# Patient Record
Sex: Male | Born: 1960 | Race: Black or African American | Hispanic: No | Marital: Married | Smoking: Never smoker
Health system: Southern US, Community
[De-identification: ages and names within clinical notes are randomized; demographics above are authoritative.]

## PROBLEM LIST (undated history)

## (undated) DIAGNOSIS — I10 Essential (primary) hypertension: Secondary | ICD-10-CM

## (undated) DIAGNOSIS — E119 Type 2 diabetes mellitus without complications: Secondary | ICD-10-CM

---

## 2014-01-27 ENCOUNTER — Ambulatory Visit (INDEPENDENT_AMBULATORY_CARE_PROVIDER_SITE_OTHER): Payer: BC Managed Care – PPO | Admitting: Podiatry

## 2014-01-27 ENCOUNTER — Encounter: Payer: Self-pay | Admitting: Podiatry

## 2014-01-27 VITALS — Ht 72.0 in | Wt 217.0 lb

## 2014-01-27 DIAGNOSIS — G629 Polyneuropathy, unspecified: Secondary | ICD-10-CM

## 2014-01-27 DIAGNOSIS — R209 Unspecified disturbances of skin sensation: Secondary | ICD-10-CM | POA: Insufficient documentation

## 2014-01-27 DIAGNOSIS — E1149 Type 2 diabetes mellitus with other diabetic neurological complication: Secondary | ICD-10-CM

## 2014-01-27 DIAGNOSIS — E1140 Type 2 diabetes mellitus with diabetic neuropathy, unspecified: Secondary | ICD-10-CM

## 2014-01-27 DIAGNOSIS — R238 Other skin changes: Secondary | ICD-10-CM

## 2014-01-27 DIAGNOSIS — M79609 Pain in unspecified limb: Secondary | ICD-10-CM

## 2014-01-27 DIAGNOSIS — B351 Tinea unguium: Secondary | ICD-10-CM

## 2014-01-27 DIAGNOSIS — M79606 Pain in leg, unspecified: Secondary | ICD-10-CM

## 2014-01-27 DIAGNOSIS — G609 Hereditary and idiopathic neuropathy, unspecified: Secondary | ICD-10-CM

## 2014-01-27 NOTE — Progress Notes (Signed)
Subjective: Cold and numb on forefoot area bilateral duration of about 2 years. 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  Diabetic for 5 years. Last blood sugar was in 200 range for the past few months from poor diet.  Stated that he has had vascular testing done at his PCP office and waiting on the result.   Review of Systems - General ROS: negative for - chills, fatigue, fever, night sweats, sleep disturbance, weight gain or weight loss Ophthalmic ROS: Recently having blurred vision. He will have them checked shoo.  ENT ROS: negative Allergy and Immunology ROS: negative Respiratory ROS: no cough, shortness of breath, or wheezing Cardiovascular ROS: no chest pain or dyspnea on exertion Gastrointestinal ROS: no abdominal pain, change in bowel habits, or black or bloody stools Genito-Urinary ROS: no dysuria, trouble voiding, or hematuria Musculoskeletal ROS: negative Neurological ROS: no TIA or stroke symptoms Dermatological ROS: negative.  Objective: Dermatologic: Dystrophic and mycotic nails on both great toes. No open lesions noted. Vascular: Left foot pedal pulses are not palpable. Right foot Dorsalis pedis is palpable. No edema or erythema noted. Neurologic: Failed to response to Monofilament sensory testing on both feet. Normal for vibratory sensation bilateral. Normal DTR response bilateral.  Orthopedic: Hypermobile first ray with digital contracture lesser digits bilateral.  Assessment: 1. NIDDM with peripheral neuropathy. 2. Deformed metatarsal bilateral. 3. Onychomycosis both great toes.  Plan: Reviewed findings and diabetic foot care. All nails debrided.  May benefit from diabetic shoes to alleviate foot pain.  Return in 10 weeks for diabetic foot care.

## 2014-01-27 NOTE — Patient Instructions (Signed)
Seen for cold and numbness on both feet. Possible diabetic neuropathy and poor circulation.  Also debrided all nails.  Return in 10 weeks for Routine foot care.

## 2014-02-03 ENCOUNTER — Ambulatory Visit (INDEPENDENT_AMBULATORY_CARE_PROVIDER_SITE_OTHER): Payer: BC Managed Care – PPO | Admitting: Podiatry

## 2014-02-03 ENCOUNTER — Encounter: Payer: Self-pay | Admitting: Podiatry

## 2014-02-03 VITALS — BP 140/72 | HR 70 | Ht 72.0 in | Wt 217.0 lb

## 2014-02-03 DIAGNOSIS — G629 Polyneuropathy, unspecified: Secondary | ICD-10-CM

## 2014-02-03 DIAGNOSIS — M79609 Pain in unspecified limb: Secondary | ICD-10-CM

## 2014-02-03 DIAGNOSIS — E1140 Type 2 diabetes mellitus with diabetic neuropathy, unspecified: Secondary | ICD-10-CM

## 2014-02-03 DIAGNOSIS — M21969 Unspecified acquired deformity of unspecified lower leg: Secondary | ICD-10-CM

## 2014-02-03 NOTE — Patient Instructions (Signed)
Both feet casted for Orthotics. Will contact when they are ready.

## 2014-02-03 NOTE — Progress Notes (Signed)
Patient came in to prepare for Orthotics. Patient has Diabetic neuropathy bilateral. Both feet X-rays taken.  Findings reveal short and elevated first ray in high arch cavus type foot bilateral.  Both feet casted for Orthotics.

## 2014-02-04 ENCOUNTER — Ambulatory Visit: Payer: BC Managed Care – PPO | Admitting: Podiatry

## 2014-04-07 ENCOUNTER — Encounter: Payer: Self-pay | Admitting: Podiatry

## 2014-04-07 ENCOUNTER — Ambulatory Visit (INDEPENDENT_AMBULATORY_CARE_PROVIDER_SITE_OTHER): Payer: BC Managed Care – PPO | Admitting: Podiatry

## 2014-04-07 ENCOUNTER — Ambulatory Visit: Payer: BC Managed Care – PPO | Admitting: Podiatry

## 2014-04-07 VITALS — BP 140/78 | HR 81

## 2014-04-07 DIAGNOSIS — M79609 Pain in unspecified limb: Secondary | ICD-10-CM

## 2014-04-07 DIAGNOSIS — B351 Tinea unguium: Secondary | ICD-10-CM

## 2014-04-07 NOTE — Progress Notes (Signed)
Subjective:  Orthotic follow up and diabetic foot care. Doing well with orthotics. Requesting toe nails trimmed.   Objective:  Dermatologic: Dystrophic and mycotic nails on both great toes.  Vascular: Left foot pedal pulses are not palpable. Right foot Dorsalis pedis is palpable.  Neurologic: Failed to response to Monofilament sensory testing on both feet. Normal for vibratory sensation bilateral. Normal DTR response bilateral.  Orthopedic: Hypermobile first ray with digital contracture lesser digits bilateral.   Assessment: 1. NIDDM with peripheral neuropathy.  2. Deformed metatarsal bilateral.  3. Onychomycosis both great toes.   Plan: All nails debrided.  Return in 10 weeks for diabetic foot care

## 2014-04-07 NOTE — Patient Instructions (Signed)
Seen for hypertrophic nails. All nails debrided. Return in 3 months or as needed.  

## 2014-04-21 ENCOUNTER — Ambulatory Visit: Payer: BC Managed Care – PPO | Admitting: Podiatry

## 2014-06-16 ENCOUNTER — Ambulatory Visit: Payer: BC Managed Care – PPO | Admitting: Podiatry

## 2016-02-07 ENCOUNTER — Encounter: Payer: Self-pay | Admitting: Podiatry

## 2016-02-07 ENCOUNTER — Ambulatory Visit (INDEPENDENT_AMBULATORY_CARE_PROVIDER_SITE_OTHER): Payer: BLUE CROSS/BLUE SHIELD | Admitting: Podiatry

## 2016-02-07 DIAGNOSIS — E0842 Diabetes mellitus due to underlying condition with diabetic polyneuropathy: Secondary | ICD-10-CM

## 2016-02-07 DIAGNOSIS — B351 Tinea unguium: Secondary | ICD-10-CM | POA: Diagnosis not present

## 2016-02-07 DIAGNOSIS — M79673 Pain in unspecified foot: Secondary | ICD-10-CM | POA: Diagnosis not present

## 2016-02-07 DIAGNOSIS — M216X9 Other acquired deformities of unspecified foot: Secondary | ICD-10-CM | POA: Diagnosis not present

## 2016-02-07 DIAGNOSIS — M722 Plantar fascial fibromatosis: Secondary | ICD-10-CM | POA: Diagnosis not present

## 2016-02-07 DIAGNOSIS — M79606 Pain in leg, unspecified: Secondary | ICD-10-CM

## 2016-02-07 DIAGNOSIS — M216X1 Other acquired deformities of right foot: Secondary | ICD-10-CM

## 2016-02-07 DIAGNOSIS — M216X2 Other acquired deformities of left foot: Principal | ICD-10-CM

## 2016-02-07 NOTE — Progress Notes (Signed)
Subjective: 55 year old male presents complaining of pain in arches and lower legs. The pain was very severe this morning. Most cases the feet hurt after been on feet for several hours. 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  He is wearing Orthotics in his work shoes.  Diabetic for 5 years and under control.   Review of Systems - General ROS: negative for - chills, fatigue, fever, night sweats, sleep disturbance, weight gain or weight loss Ophthalmic ROS: Recently having blurred vision. He will have them checked shoo.  ENT ROS: negative Allergy and Immunology ROS: negative Respiratory ROS: no cough, shortness of breath, or wheezing Cardiovascular ROS: no chest pain or dyspnea on exertion Gastrointestinal ROS: no abdominal pain, change in bowel habits, or black or bloody stools Genito-Urinary ROS: no dysuria, trouble voiding, or hematuria Musculoskeletal ROS: negative Neurological ROS: no TIA or stroke symptoms Dermatological ROS: negative.  Objective: Dermatologic: Dystrophic and mycotic nails on both great toes. No open lesions noted. Vascular: Left foot pedal pulses are not palpable. Right foot Dorsalis pedis is palpable. No edema or erythema noted. Neurologic: Positive of subjective numbness on both feet.  Orthopedic: Hypermobile first ray with digital contracture lesser digits bilateral. Tight Achilles tendon bilateral. Unable to dorsiflex beyond 90 degrees at the ankle joint with knee extended position.   Assessment: 1. NIDDM with peripheral neuropathy. 2. Deformed metatarsal bilateral. 3. Onychomycosis both great toes. 4. Ankle equinus bilateral. 5. Pain in mid plantar fascial band bilateral.   Plan: Reviewed findings and diabetic foot care. All nails debrided.  Reviewed stretch exercise for tight Achilles tendon bilateral. Advised to get quality work boots.  May benefit from Physical Therapy Consult.  Return in one month for follow up.

## 2016-02-07 NOTE — Patient Instructions (Signed)
Seen for pain in arch and lower limb bilateral. Noted of tight Achilles tendon bilateral.  Need daily stretch exercise. Will benefit from Physical Therapy.  Return in one month.

## 2016-02-09 ENCOUNTER — Ambulatory Visit: Payer: Self-pay | Admitting: Rehabilitative and Restorative Service Providers"

## 2016-02-11 ENCOUNTER — Ambulatory Visit: Payer: Self-pay | Admitting: Rehabilitative and Restorative Service Providers"

## 2016-02-16 ENCOUNTER — Encounter: Payer: Self-pay | Admitting: Rehabilitative and Restorative Service Providers"

## 2016-02-16 ENCOUNTER — Ambulatory Visit (INDEPENDENT_AMBULATORY_CARE_PROVIDER_SITE_OTHER): Payer: BLUE CROSS/BLUE SHIELD | Admitting: Rehabilitative and Restorative Service Providers"

## 2016-02-16 DIAGNOSIS — M79672 Pain in left foot: Secondary | ICD-10-CM | POA: Diagnosis not present

## 2016-02-16 DIAGNOSIS — R29898 Other symptoms and signs involving the musculoskeletal system: Secondary | ICD-10-CM

## 2016-02-16 DIAGNOSIS — M79671 Pain in right foot: Secondary | ICD-10-CM

## 2016-02-16 NOTE — Therapy (Signed)
[facility] [address] [place], [place], [address] Phone: [phone]   Fax:  [phone]  Physical Therapy Evaluation  Patient Details  Name: Christopher Erickson MRN: [id number] Date of Birth: 1961/06/17 Referring Provider: Dr. Koren Shiver   Encounter Date: 02/16/2016      PT End of Session - 02/16/16 1610    Visit Number 1   Number of Visits 12   Date for PT Re-Evaluation 04/02/16   PT Start Time 1610   PT Stop Time 1705   PT Time Calculation (min) 55 min   Activity Tolerance Patient tolerated treatment well      History reviewed. No pertinent past medical history.  History reviewed. No pertinent past surgical history.  There were no vitals filed for this visit.       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 Christopher Erickson has pain in bilat feet on the bottom of both feet about equally - present for ~3 years. Christopher Erickson has been treated by MD with stretches which have helped some. Christopher Erickson has had an insert for the past couple of years. Feels some "numbness" in the back of both calves.    Pertinent History Denies any other musculoskeletal problems    How long can you sit comfortably? no problem    How long can you stand comfortably? 5 hours    How long can you walk comfortably? 5 hours    Patient Stated Goals get rid of the foot pain    Currently in Pain? Yes   Pain Score 5    Pain Location Foot   Pain Orientation --  right and left - both the same    Pain Descriptors / Indicators Aching;Sore   Pain Type Chronic pain   Pain Radiating Towards up into the calves    Pain Onset More than a month ago   Pain Frequency Intermittent   Aggravating Factors  prolonged standing; walking; get tight at night    Pain Relieving Factors stretches             [facility] PT Assessment - 02/16/16 0001    Assessment   Medical Diagnosis Bilat foot pain    Referring Provider  Dr. Koren Shiver    Onset Date/Surgical Date 01/19/13   Hand Dominance Right   Next MD Visit 7/17   Prior Therapy none    Precautions   Precautions None   Required Braces or Orthoses --  has inserts for shoes    Balance Screen   Has the patient fallen in the past 6 months No   Has the patient had a decrease in activity level because of a fear of falling?  No   Is the patient reluctant to leave their home because of a fear of falling?  No   Prior Function   Level of Independence Independent   Vocation Full time employment  working 60 hr/wk    [employer] - lifting; reading; pulling; pushing; walking; etc     Leisure yard work; church; going out to eat    Observation/Other Assessments   Focus on Therapeutic Outcomes (FOTO)  25% limitation    Sensation   Additional Comments burning in the bottom of his feet    Posture/Postural Control   Posture Comments stands with knees hyperextended; increased weight bearing along the lateral border of both feet   AROM   Overall AROM Comments Some end range  tightness through bilat hips in all planes    Right Ankle Dorsiflexion -12   Right Ankle Plantar Flexion 52   Right Ankle Inversion 20   Right Ankle Eversion 8   Left Ankle Dorsiflexion -8   Left Ankle Plantar Flexion 40   Left Ankle Inversion 27   Left Ankle Eversion 8   Strength   Overall Strength Comments 5/5 bilat LE's except heel raises 5/-5 Rt; 4+/5 Lt    Flexibility   Hamstrings tightness bilat ~ 65-70 deg    Quadriceps Rt 120 deg knee flexion, Lt 114 knee flexion    ITB tight bilat    Piriformis tight bilat R > Lt    Palpation   Palpation comment tender to palpation along the plantar surface of bilat feet toes to calcaneous    Balance   Balance Assessed --  SLS - 10 sec Rt; 6 sec Lt without UE support    Functional Gait  Assessment   Gait assessed  --  ambulates with increased WB lateral borders of feet                    [facility] Adult PT  Treatment/Exercise - 02/16/16 0001    Self-Care   Self-Care Other Self-Care Comments   Other Self-Care Comments  Pt educated on how to ice/ ice massage to bottom feet. Pt verbalized understanding   Exercises   Exercises Knee/Hip   Knee/Hip Exercises: Stretches   Passive Hamstring Stretch Right;Left;3 reps;30 seconds   Quad Stretch Right;Left;2 reps;30 seconds   ITB Stretch Right;Left;2 reps;30 seconds   Piriformis Stretch Left;Right;2 reps;30 seconds   Gastroc Stretch Right;Left;2 reps;30 seconds   Soleus Stretch Right;Left;2 reps;30 seconds   Manual Therapy   Manual Therapy Taping   Manual therapy comments Rock tape applied to plantar surface of both feet to decrease pain and provide support.                  PT Education - 02/16/16 1710    Education provided Yes   Education Details HEP    Person(s) Educated Patient   Methods Explanation;Demonstration;Tactile cues;Verbal cues;Handout   Comprehension Verbalized understanding;Returned demonstration;Verbal cues required;Tactile cues required             PT Long Term Goals - 02/16/16 1644    PT LONG TERM GOAL #1   Title Improve bilat ankle DF to 5-8 deg 04/02/16   Time 6   Period Weeks   Status New   PT LONG TERM GOAL #2   Title Improve flexibility through bilat LE's with patient to demonstrate good moblity with stretching for LE's 04/02/16   Time 6   Period Weeks   Status New   PT LONG TERM GOAL #3   Title Increase standing and walking tolerance allowing patient to stand for 6-8 hr/day without significant increase in pain 04/02/16   Time 6   Period Weeks   Status New   PT LONG TERM GOAL #4   Title Independent in HEP 04/02/16   Time 6   Period Weeks   Status New   PT LONG TERM GOAL #5   Title Improve FOTO to </= 22% limitation 04/02/16   Time 6   Period Weeks   Status New               Plan - 02/16/16 1640    Clinical Impression Statement Christopher Erickson presents with c/o bilat foot pain which is  chronic in nature - present for the  past 3 years. Christopher Erickson has limited mobility thorugh bilat hips and ankles; weakness bilat ankle plantar flexion; decresed balance; pain with functional activities.    Rehab Potential Good   PT Frequency 2x / week   PT Duration 6 weeks   PT Treatment/Interventions Patient/family education;ADLs/Self Care Home Management;Cryotherapy;Electrical Stimulation;Iontophoresis 4mg /ml Dexamethasone;Moist Heat;Ultrasound;Balance training;Neuromuscular re-education;Manual techniques;Dry needling;Therapeutic activities;Therapeutic exercise   PT Next Visit Plan progress with stretching; manual work through the [employer] Pepsigastroc/soleus; mobilization bilat ankles; modalities as indicated    Consulted and Agree with Plan of Care Patient      Patient will benefit from skilled therapeutic intervention in order to improve the following deficits and impairments:  Improper body mechanics, Pain, Decreased range of motion, Decreased mobility, Decreased strength, Increased fascial restricitons, Increased muscle spasms, Decreased balance, Decreased activity tolerance  Visit Diagnosis: Pain in left foot - Plan: PT plan of care cert/re-cert  Pain in right foot - Plan: PT plan of care cert/re-cert  Other symptoms and signs involving the musculoskeletal system - Plan: PT plan of care cert/re-cert     Problem List Patient Active Problem List   Diagnosis Date Noted  . 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 Bilateral cold feet 01/27/2014  . Peripheral neuropathy (HCC) 01/27/2014  . Diabetic neuropathy, type II diabetes mellitus (HCC) 01/27/2014  . Onychomycosis 01/27/2014    Christopher Erickson PT, MPH  02/16/2016, 5:14 PM  [facility] [address] [place], [place], [address] Phone: [phone]   Fax:  [phone]  Name: Christopher Erickson MRN: [id number] Date of Birth: 12/21/1960

## 2016-02-16 NOTE — Patient Instructions (Signed)
Hamstring Step 1    Straighten left knee. Keep knee level with other knee or on bolster. Hold __30_ seconds. Relax knee by returning foot to start. Repeat _2-3__ times. Outer Hip Stretch: Reclined IT Band Stretch (Strap)    Strap around opposite foot, pull across only as far as possible with shoulders on mat. Hold for __30__ seconds.  Repeat _2-3___ times each leg.  Piriformis Stretch    Lying on back, pull right knee toward opposite shoulder. Hold __30__ seconds. Repeat _2-3___ times. Do __2__ sessions per day.  KNEE: Quadriceps - Prone    Place strap around ankle. Bring ankle toward buttocks. Press hip into surface. Hold __30_ seconds. _2__ reps per set, _2__ sets per day, _   * ice to bottom of feet x 10-15 min.  * ice massage (water bottle) to bottom of feet   [facility] [address] [place], [place] [address]  [phone] (office) [phone] (fax)

## 2016-02-23 ENCOUNTER — Ambulatory Visit (INDEPENDENT_AMBULATORY_CARE_PROVIDER_SITE_OTHER): Payer: BLUE CROSS/BLUE SHIELD | Admitting: Physical Therapy

## 2016-02-23 DIAGNOSIS — R29898 Other symptoms and signs involving the musculoskeletal system: Secondary | ICD-10-CM

## 2016-02-23 DIAGNOSIS — M79671 Pain in right foot: Secondary | ICD-10-CM | POA: Diagnosis not present

## 2016-02-23 DIAGNOSIS — M79672 Pain in left foot: Secondary | ICD-10-CM | POA: Diagnosis not present

## 2016-02-23 NOTE — Therapy (Signed)
[facility] [address] [place], [place], [address] Phone: [phone]   Fax:  [phone]  Physical Therapy Treatment  Patient Details  Name: Christopher Erickson MRN: [id number] Date of Birth: 10/25/1960 Referring Provider: Dr. Koren ShiverMyeong Sheard  Encounter Date: 02/23/2016      PT End of Session - 02/23/16 1612    Visit Number 2   Number of Visits 12   Date for PT Re-Evaluation 04/02/16   PT Start Time 1605   PT Stop Time 1652   PT Time Calculation (min) 47 min   Activity Tolerance Patient tolerated treatment well;No increased pain      No past medical history on file.  No past surgical history on file.  There were no vitals filed for this visit.      Subjective Assessment - 02/23/16 1613    Subjective Won reports 60% improvement since last visit. Compliant with HEP. His legs throb after he performs them, but a little after that legs are less painful.  He  reports the Rock tape stayed on his feet for 5 days; helped reduce pain by 4 points.    Numbness in feet and back of calves has persisted, although reduced.     Patient Stated Goals get rid of the foot pain    Currently in Pain? Yes   Pain Score 5    Pain Location Foot   Pain Orientation Right;Left   Pain Descriptors / Indicators Sore            [facility] PT Assessment - 02/23/16 0001    Assessment   Medical Diagnosis Bilat foot pain    Referring Provider Dr. Koren ShiverMyeong Sheard   Onset Date/Surgical Date 01/19/13   Hand Dominance Right   Next MD Visit 03/06/16   AROM   Right Ankle Dorsiflexion -8   Right Ankle Plantar Flexion 55   Right Ankle Inversion 34   Right Ankle Eversion 15   Left Ankle Dorsiflexion -5   Left Ankle Plantar Flexion 40   Left Ankle Inversion 28   Left Ankle Eversion 15   Flexibility   Hamstrings Rt/Lt ~55 deg           [facility] Adult PT Treatment/Exercise - 02/23/16 0001    Knee/Hip Exercises: Stretches   Passive Hamstring  Stretch Right;Left;3 reps;30 seconds   Quad Stretch Right;Left;2 reps;30 seconds   ITB Stretch Right;Left;2 reps;30 seconds   Gastroc Stretch Right;Left;30 seconds;4 reps   Soleus Stretch Right;Left;30 seconds;4 reps   Knee/Hip Exercises: Aerobic   Nustep L4: 5 mi n   Manual Therapy   Manual Therapy Taping;Joint mobilization;Soft tissue mobilization   Manual therapy comments Rock tape applied to plantar surface of both feet to decrease pain and provide support.     Joint Mobilization Talocrural jt mobs Rt/Lt Grade 2-3.  calcaneal rock.     Soft tissue mobilization to Rt/ Lt plantar fascia.    Ankle Exercises: Stretches   Other Stretch shown ankle stretch off stair.     Ankle Exercises: Seated   Other Seated Ankle Exercises ankle circles x 15 reps each foot.                 PT Education - 02/23/16 1632    Education provided Yes   Education Details HEP   Person(s) Educated Patient   Methods Explanation   Comprehension Verbalized understanding;Returned demonstration             PT Long Term Goals - 02/16/16 1644  PT LONG TERM GOAL #1   Title Improve bilat ankle DF to 5-8 deg 04/02/16   Time 6   Period Weeks   Status New   PT LONG TERM GOAL #2   Title Improve flexibility through bilat LE's with patient to demonstrate good moblity with stretching for LE's 04/02/16   Time 6   Period Weeks   Status New   PT LONG TERM GOAL #3   Title Increase standing and walking tolerance allowing patient to stand for 6-8 hr/day without significant increase in pain 04/02/16   Time 6   Period Weeks   Status New   PT LONG TERM GOAL #4   Title Independent in HEP 04/02/16   Time 6   Period Weeks   Status New   PT LONG TERM GOAL #5   Title Improve FOTO to </= 22% limitation 04/02/16   Time 6   Period Weeks   Status New               Plan - 02/23/16 1655    Clinical Impression Statement Pt had positive response to stretches and Rock tape applied to plantar surface of  feet.  Pt demonstrated slight improvement in ankle ROM.  Pt tolerated new HEP exercise.     Rehab Potential Good   PT Frequency 2x / week   PT Duration 6 weeks   PT Treatment/Interventions Patient/family education;ADLs/Self Care Home Management;Cryotherapy;Electrical Stimulation;Iontophoresis 4mg /ml Dexamethasone;Moist Heat;Ultrasound;Balance training;Neuromuscular re-education;Manual techniques;Dry needling;Therapeutic activities;Therapeutic exercise   PT Next Visit Plan progress with stretching; manual work through the [employer] Pepsigastroc/soleus; mobilization bilat ankles; modalities as indicated    Consulted and Agree with Plan of Care Patient      Patient will benefit from skilled therapeutic intervention in order to improve the following deficits and impairments:  Improper body mechanics, Pain, Decreased range of motion, Decreased mobility, Decreased strength, Increased fascial restricitons, Increased muscle spasms, Decreased balance, Decreased activity tolerance  Visit Diagnosis: Pain in left foot  Pain in right foot  Other symptoms and signs involving the musculoskeletal system     Problem List Patient Active Problem List   Diagnosis Date Noted  . 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 Bilateral cold feet 01/27/2014  . Peripheral neuropathy (HCC) 01/27/2014  . Diabetic neuropathy, type II diabetes mellitus (HCC) 01/27/2014  . Onychomycosis 01/27/2014   Mayer CamelJennifer Carlson-Long, PTA 02/23/2016 5:01 PM  [facility] [address] [place], [place], [address] Phone: [phone]   Fax:  [phone]  Name: Christopher Erickson MRN: [id number] Date of Birth: 04/26/1961

## 2016-02-23 NOTE — Patient Instructions (Addendum)
Soleus Stretch - Standing    Stand with uninvolved leg behind, slightly bent, heel on floor. Lean into wall until stretch is felt in calf. Hold for __30_ seconds. Repeat on involved leg. Repeat _2__ times. Do _2__ times per day.  * Hang heels off step (both a straight knee, then a bent knee.) Ankle Circles    Slowly rotate right foot and ankle clockwise then counterclockwise. Gradually increase range of motion. Avoid pain. Circle _10___ times each direction per set. Do _2___ sets per session. Do __1-2__ sessions per day.    [facility] [address] [place], [place] [address]  [phone] (office) [phone] (fax)

## 2016-02-25 ENCOUNTER — Encounter: Payer: BLUE CROSS/BLUE SHIELD | Admitting: Physical Therapy

## 2016-03-01 ENCOUNTER — Ambulatory Visit (INDEPENDENT_AMBULATORY_CARE_PROVIDER_SITE_OTHER): Payer: Self-pay | Admitting: Rehabilitative and Restorative Service Providers"

## 2016-03-01 ENCOUNTER — Encounter: Payer: Self-pay | Admitting: Rehabilitative and Restorative Service Providers"

## 2016-03-01 DIAGNOSIS — R29898 Other symptoms and signs involving the musculoskeletal system: Secondary | ICD-10-CM

## 2016-03-01 DIAGNOSIS — M79672 Pain in left foot: Secondary | ICD-10-CM

## 2016-03-01 DIAGNOSIS — M79671 Pain in right foot: Secondary | ICD-10-CM

## 2016-03-01 NOTE — Therapy (Signed)
[facility] [address] [place], [place], [address] Phone: [phone]   Fax:  [phone]  Physical Therapy Treatment  Patient Details  Name: Christopher MaskerSylvester Dise MRN: [id number] Date of Birth: 10/16/1960 Referring Provider: Dr. Koren ShiverMyeong Sheard  Encounter Date: 03/01/2016      PT End of Session - 03/01/16 1521    Visit Number 3   Number of Visits 12   Date for PT Re-Evaluation 04/02/16   PT Start Time 1515   PT Stop Time 1605   PT Time Calculation (min) 50 min   Activity Tolerance Patient tolerated treatment well      History reviewed. No pertinent past medical history.  History reviewed. No pertinent past surgical history.  There were no vitals filed for this visit.      Subjective Assessment - 03/01/16 1524    Subjective some flare up of the foot pain today - has been standing and walking much more today. TRape helps but only stayed on 2 days due to the heat. Working on exercises at home.   Currently in Pain? Yes   Pain Score 6    Pain Location Foot   Pain Orientation Right;Left   Pain Descriptors / Indicators Sore   Pain Type Chronic pain   Pain Onset More than a month ago                         [facility] - 03/01/16 0001    Therapeutic Activites    Therapeutic Activities --  trigger point release work/stick for bilat calf/thigh/foot   Knee/Hip Exercises: Stretches   Passive Hamstring Stretch Right;Left;3 reps;30 seconds   Quad Stretch Right;Left;2 reps;30 seconds   ITB Stretch Right;Left;2 reps;30 seconds   Piriformis Stretch Right;Left;3 reps;30 seconds   Piriformis Stretch Limitations ankle across knee to stretch into rotation 3 x 30 sec Rt/Lt    Gastroc Stretch Right;Left;30 seconds;4 reps   Soleus Stretch Right;Left;30 seconds;4 reps   Knee/Hip Exercises: Aerobic   Nustep L7: 5 min   Knee/Hip Exercises: Supine   Other Supine Knee/Hip Exercises alternate  knee drop for relaxation - in hooklying x 10-15   Manual Therapy   Manual Therapy Taping;Joint mobilization;Soft tissue mobilization   Manual therapy comments Rock tape applied to plantar surface of both feet to decrease pain and provide support.     Joint Mobilization Talocrural jt mobs Rt/Lt Grade 2-3.  calcaneal rock.     Soft tissue mobilization to Rt/ Lt plantar fascia.           Trigger Point Dry Needling - 03/01/16 1552    Consent Given? Yes   Education Handout Provided Yes   Gastrocnemius Response Twitch response elicited;Palpable increased muscle length   Soleus Response Twitch response elicited;Palpable increased muscle length              PT Education - 03/01/16 1604    Education provided Yes   Education Details HEP TDN   Person(s) Educated Patient   Methods Explanation   Comprehension Verbalized understanding             PT Long Term Goals - 03/01/16 1523    PT LONG TERM GOAL #1   Title Improve bilat ankle DF to 5-8 deg 04/02/16   Time 6   Period Weeks   Status On-going   PT LONG TERM GOAL #2   Title Improve flexibility through bilat LE's with patient to demonstrate good moblity with  stretching for LE's 04/02/16   Time 6   Period Weeks   Status On-going   PT LONG TERM GOAL #3   Title Increase standing and walking tolerance allowing patient to stand for 6-8 hr/day without significant increase in pain 04/02/16   Time 6   Period Weeks   Status On-going   PT LONG TERM GOAL #4   Title Independent in HEP 04/02/16   Time 6   Period Weeks   Status On-going   PT LONG TERM GOAL #5   Title Improve FOTO to </= 22% limitation 04/02/16   Time 6   Period Weeks   Status On-going               Plan - 03/01/16 1522    Clinical Impression Statement some increase in pain today from more walking and work. Tape seems to help - only stayed on 2 days this time. Good improvement in tissue extensibility. Responded well to TDN. Progressing toward stated goals  of therapy.    Rehab Potential Good   PT Frequency 2x / week   PT Duration 6 weeks   PT Treatment/Interventions Patient/family education;ADLs/Self Care Home Management;Cryotherapy;Electrical Stimulation;Iontophoresis /ml Dexamethasone;Moist Heat;Ultrasound;Balance training;Neuromuscular re-education;Manual techniques;Dry needling;Therapeutic activities;Therapeutic exercise   PT Next Visit Plan progress with stretching; manual work through the [employer]; mobilization bilat ankles; modalities as indicated  Assess response to TDN    Consulted and Agree with Plan of Care Patient      Patient will benefit from skilled therapeutic intervention in order to improve the following deficits and impairments:  Improper body mechanics, Pain, Decreased range of motion, Decreased mobility, Decreased strength, Increased fascial restricitons, Increased muscle spasms, Decreased balance, Decreased activity tolerance  Visit Diagnosis: Pain in left foot  Pain in right foot  Other symptoms and signs involving the musculoskeletal system     Problem List Patient Active Problem List   Diagnosis Date Noted  . 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 Bilateral cold feet 01/27/2014  . Peripheral neuropathy (HCC) 01/27/2014  . Diabetic neuropathy, type II diabetes mellitus (HCC) 01/27/2014  . Onychomycosis 01/27/2014    Renee Beale Rober Minion  PT, MPH  03/01/2016, 4:11 PM  [facility] [address] [place], [place], [address] Phone: [phone]   Fax:  [phone]  Name: Christopher Erickson MRN: [id number] Date of Birth: 29-Jun-1961

## 2016-03-01 NOTE — Patient Instructions (Signed)

## 2016-03-06 ENCOUNTER — Ambulatory Visit: Payer: BLUE CROSS/BLUE SHIELD | Admitting: Podiatry

## 2016-03-08 ENCOUNTER — Ambulatory Visit (INDEPENDENT_AMBULATORY_CARE_PROVIDER_SITE_OTHER): Payer: Self-pay | Admitting: Rehabilitative and Restorative Service Providers"

## 2016-03-08 ENCOUNTER — Encounter: Payer: Self-pay | Admitting: Rehabilitative and Restorative Service Providers"

## 2016-03-08 DIAGNOSIS — R29898 Other symptoms and signs involving the musculoskeletal system: Secondary | ICD-10-CM

## 2016-03-08 DIAGNOSIS — M79671 Pain in right foot: Secondary | ICD-10-CM

## 2016-03-08 DIAGNOSIS — M79672 Pain in left foot: Secondary | ICD-10-CM

## 2016-03-08 NOTE — Therapy (Addendum)
[facility] [place], [place], [address] Phone: [phone]   Fax:  [phone]  Physical Therapy Treatment  Patient Details  Name: Christopher Erickson MRN: [id number] Date of Birth: [birth year] Referring Provider: Dr. Estelle Grumbles  Encounter Date: 03/08/2016      PT End of Session - 03/08/16 1607    Visit Number 4   Number of Visits 12   Date for PT Re-Evaluation 04/02/16   PT Start Time 1600   PT Stop Time 1651   PT Time Calculation (min) 51 min   Activity Tolerance Patient tolerated treatment well      History reviewed. No pertinent past medical history.  History reviewed. No pertinent past surgical history.  There were no vitals filed for this visit.      Subjective Assessment - 03/08/16 1609    Subjective patient reports that he continues to improve. Tape continues to help and lasted 3 days this time. TDN helped but he was very sore for 1-2 days. has some numbness in his toes.    Currently in Pain? Yes   Pain Score 4    Pain Location Foot   Pain Orientation Right;Left   Pain Descriptors / Indicators Sore   Pain Type Chronic pain   Pain Onset More than a month ago   Pain Frequency Intermittent                         [facility] Adult PT Treatment/Exercise - 03/08/16 0001    Knee/Hip Exercises: Stretches   Passive Hamstring Stretch Right;Left;3 reps;30 seconds   Quad Stretch Right;Left;2 reps;30 seconds   ITB Stretch Right;Left;2 reps;30 seconds   Piriformis Stretch Right;Left;3 reps;30 seconds   Gastroc Stretch Right;Left;30 seconds;4 reps   Soleus Stretch Right;Left;30 seconds;4 reps   Knee/Hip Exercises: Aerobic   Nustep L7: 5 min                     PT Long Term Goals - 03/08/16 1644    PT LONG TERM GOAL #1   Title Improve bilat ankle DF to 5-8 deg 04/02/16   Time 6   Period Weeks   Status On-going   PT LONG TERM GOAL #2   Title Improve flexibility  through bilat LE's with patient to demonstrate good moblity with stretching for LE's 04/02/16   Time 6   Period Weeks   Status On-going   PT LONG TERM GOAL #3   Title Increase standing and walking tolerance allowing patient to stand for 6-8 hr/day without significant increase in pain 04/02/16   Time 6   Period Weeks   Status On-going   PT LONG TERM GOAL #4   Title Independent in HEP 04/02/16   Time 6   Period Weeks   Status On-going   PT LONG TERM GOAL #5   Title Improve FOTO to </= 22% limitation 04/02/16   Time 6   Period Weeks   Status On-going               Plan - 03/08/16 1641    Clinical Impression Statement Patient reports improved pain and mobility through the feet and legs. Cont to respond to taping and stretching. Progressing gradually toward stated goals.    Rehab Potential Good   PT Frequency 2x / week   PT Duration 6 weeks   PT Treatment/Interventions Patient/family education;ADLs/Self Care Home Management;Cryotherapy;Electrical Stimulation;Iontophoresis 75m/ml Dexamethasone;Moist Heat;Ultrasound;Balance training;Neuromuscular re-education;Manual techniques;Dry needling;Therapeutic activities;Therapeutic exercise  PT Next Visit Plan progress with stretching; manual work through the [employer]; mobilization bilat ankles; modalities as indicated  Assess response to TDN    Consulted and Agree with Plan of Care Patient      Patient will benefit from skilled therapeutic intervention in order to improve the following deficits and impairments:  Improper body mechanics, Pain, Decreased range of motion, Decreased mobility, Decreased strength, Increased fascial restricitons, Increased muscle spasms, Decreased balance, Decreased activity tolerance  Visit Diagnosis: Pain in left foot  Pain in right foot  Other symptoms and signs involving the musculoskeletal system     Problem List Patient Active Problem List   Diagnosis Date Noted  . Metatarsal deformity  02/03/2014  . Bilateral cold feet 01/27/2014  . Peripheral neuropathy ([place]) 01/27/2014  . Diabetic neuropathy, type II diabetes mellitus ([place]) 01/27/2014  . Onychomycosis 01/27/2014    Christopher Erickson Nilda Simmer PT, MPH  03/08/2016, 4:47 PM  [facility] Christopher Erickson [place], [place], [address] Phone: [phone]   Fax:  [phone]  Name: Christopher Erickson MRN: [id number] Date of Birth: 01-11-1961  PHYSICAL THERAPY DISCHARGE SUMMARY  Visits from Start of Care: 4  Current functional level related to goals / functional outcomes: Excellent progress through course of treatment. See last progress note for discharge status.    Remaining deficits: Continued intermittent symptoms - should continue with HEP    Education / Equipment: HEP  Plan: Patient agrees to discharge.  Patient goals were partially met. Patient is being discharged due to being pleased with the current functional level.  ?????     Christopher Erickson P. Helene Kelp PT, MPH 07/17/16 7:55 AM

## 2016-03-15 ENCOUNTER — Encounter: Payer: BLUE CROSS/BLUE SHIELD | Admitting: Rehabilitative and Restorative Service Providers"

## 2016-03-15 ENCOUNTER — Ambulatory Visit: Payer: BLUE CROSS/BLUE SHIELD | Admitting: Podiatry

## 2016-03-22 ENCOUNTER — Ambulatory Visit: Payer: BLUE CROSS/BLUE SHIELD | Admitting: Podiatry

## 2016-03-30 ENCOUNTER — Ambulatory Visit (INDEPENDENT_AMBULATORY_CARE_PROVIDER_SITE_OTHER): Payer: BLUE CROSS/BLUE SHIELD | Admitting: Podiatry

## 2016-03-30 ENCOUNTER — Encounter: Payer: Self-pay | Admitting: Podiatry

## 2016-03-30 VITALS — BP 134/78 | HR 69

## 2016-03-30 DIAGNOSIS — M216X1 Other acquired deformities of right foot: Secondary | ICD-10-CM | POA: Diagnosis not present

## 2016-03-30 DIAGNOSIS — M722 Plantar fascial fibromatosis: Secondary | ICD-10-CM

## 2016-03-30 DIAGNOSIS — M216X2 Other acquired deformities of left foot: Secondary | ICD-10-CM | POA: Diagnosis not present

## 2016-03-30 DIAGNOSIS — E0842 Diabetes mellitus due to underlying condition with diabetic polyneuropathy: Secondary | ICD-10-CM | POA: Diagnosis not present

## 2016-03-30 NOTE — Patient Instructions (Signed)
1 month follow up on tight Achilles tendon. Doing well with stretch exercise. Noted of pre ulcerative digital corn 5th digit bilateral. All debrided. Return in 2 months.

## 2016-03-30 NOTE — Progress Notes (Signed)
Subjective: 55 year old male presents one month follow up. Stated that he has been stretching the tendon as instructed and noted of improvement on his feet.  Also request toe nails trimmed.   He was seen previously for pain in arches and lower legs. Most cases the feet hurt after been on feet for several hours. 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  He is wearing Orthotics in his work shoes.  Diabetic for 5 years and under control with blood sugar in 130 level.  Objective: Dermatologic: Dystrophic and mycotic nails on both great toes. Pre ulcerative digital corn 5th digit bilateral.   Vascular: Left foot pedal pulses are not palpable. Right foot Dorsalis pedis is palpable. No edema or erythema noted. Neurologic: Positive of subjective numbness on both feet.  Orthopedic: Hypermobile first ray with digital contracture lesser digits bilateral. Tight Achilles tendon bilateral. Unable to dorsiflex beyond 90 degrees at the ankle joint with knee extended position.   Assessment: 1. NIDDM with peripheral neuropathy. 2. Deformed metatarsal bilateral. 3. Onychomycosis both great toes. 4. Ankle equinus bilateral. 5. Pain in mid plantar fascial band bilateral.  6. 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   Plan: Reviewed findings and diabetic foot care. All nails, corns debrided.  Continue with stretch exercise for tight Achilles tendon bilateral. Return in 2 month to monitor pre ulcerative digital corn 5th bilateral.

## 2016-04-25 ENCOUNTER — Ambulatory Visit: Payer: BLUE CROSS/BLUE SHIELD | Admitting: Podiatry

## 2016-06-01 ENCOUNTER — Ambulatory Visit: Payer: BLUE CROSS/BLUE SHIELD | Admitting: Podiatry

## 2017-01-16 ENCOUNTER — Ambulatory Visit (INDEPENDENT_AMBULATORY_CARE_PROVIDER_SITE_OTHER): Payer: BLUE CROSS/BLUE SHIELD | Admitting: Podiatry

## 2017-01-16 ENCOUNTER — Encounter: Payer: Self-pay | Admitting: Podiatry

## 2017-01-16 DIAGNOSIS — M722 Plantar fascial fibromatosis: Secondary | ICD-10-CM | POA: Diagnosis not present

## 2017-01-16 DIAGNOSIS — M79671 Pain in right foot: Secondary | ICD-10-CM | POA: Diagnosis not present

## 2017-01-16 DIAGNOSIS — L851 Acquired keratosis [keratoderma] palmaris et plantaris: Secondary | ICD-10-CM

## 2017-01-16 DIAGNOSIS — M79672 Pain in left foot: Secondary | ICD-10-CM

## 2017-01-16 DIAGNOSIS — B351 Tinea unguium: Secondary | ICD-10-CM | POA: Diagnosis not present

## 2017-01-16 NOTE — Patient Instructions (Signed)
Seen for painful arch, corns, calluses and toe nails. All lesions debrided. Both feet casted for Orthotics. Will call when they are ready.

## 2017-01-16 NOTE — Progress Notes (Signed)
Subjective: 56 year old male presents complaining of pain from right heel callus, digital corn, thick toe nails. Arched of both feet hurt at work and in need of a new pair orthotics. He is working outdoor 6-10 hours a day.   HPI: He was seen previously for pain in arches and lower legs. Most cases the feet hurt after been on feet for several hours. 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  He is wearing Orthotics in his work shoes.  Diabetic for 5 years and under control with blood sugar in 130 level.  Objective: Dermatologic: Dystrophic and mycotic nails on both great toes. Pre ulcerative digital corn 5th digit bilateral.   Thick keratotic lesion right posterior heel painful.  Vascular: Left foot pedal pulses are not palpable. Right foot Dorsalis pedis is palpable. No edema or erythema noted. Neurologic: Positive of subjective numbness on both feet.  Orthopedic: Contracted 5th digits with digital corns. Hypermobile first ray with digital contracture lesser digits bilateral. Tight Achilles tendon bilateral. Unable to dorsiflex beyond 90 degrees at the ankle joint with knee extended position.   Assessment: 1. NIDDM with peripheral neuropathy. 2. Deformed metatarsal bilateral. 3. Onychomycosis both great toes. 4. Ankle equinus bilateral. 5. Pain in mid plantar fascial band bilateral.  6. 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   Plan: Reviewed findings and diabetic foot care. All nails, corns debrided.  Continue with stretch exercise for tight Achilles tendon bilateral. Both feet casted for orthotics.

## 2017-01-17 ENCOUNTER — Ambulatory Visit: Payer: BLUE CROSS/BLUE SHIELD | Admitting: Podiatry

## 2017-10-11 ENCOUNTER — Ambulatory Visit: Payer: BLUE CROSS/BLUE SHIELD | Admitting: Podiatry

## 2017-10-11 ENCOUNTER — Encounter: Payer: Self-pay | Admitting: Podiatry

## 2017-10-11 DIAGNOSIS — L851 Acquired keratosis [keratoderma] palmaris et plantaris: Secondary | ICD-10-CM | POA: Diagnosis not present

## 2017-10-11 DIAGNOSIS — E0842 Diabetes mellitus due to underlying condition with diabetic polyneuropathy: Secondary | ICD-10-CM

## 2017-10-11 DIAGNOSIS — M79606 Pain in leg, unspecified: Secondary | ICD-10-CM

## 2017-10-11 DIAGNOSIS — B351 Tinea unguium: Secondary | ICD-10-CM

## 2017-10-11 NOTE — Patient Instructions (Signed)
Seen for painful nails and calluses. 5th toe right foot is pre ulcerative. Need to stay off of feet more. All lesions and nails debrided.

## 2017-10-11 NOTE — Progress Notes (Signed)
Subjective: 57 y.o. year old male patient presents complaining of painful nails and calluses. Patient requests toe nails, corns and calluses trimmed. Patient works outside doing yard and tree trimming. Having abnormal feelings under the ball of both feet.  Objective: Dermatologic: Thick yellow deformed nails x 10.  Pre ulcerative digital corn with intra dermal bleeding distal lateral 5th right. Severe plantar keratosis under 5th metatarsal base right foot. Vascular: Pedal pulses are all palpable. Orthopedic: Contracted lesser digit  5th right Neurologic: Diagnosed with diabetic neuropathy.  Assessment: Dystrophic mycotic nails x 10. Ulcerating corn 5th right with hammer toe. Lateral weight shifting with plantar callus right. Pain in both feet. Diabetic neuropathy.  Treatment: All mycotic nails, corns, calluses debrided.  Instructed to watch out for 5th toe right. Return in 3 months or sooner if needed.

## 2020-01-17 ENCOUNTER — Emergency Department (HOSPITAL_COMMUNITY): Payer: BLUE CROSS/BLUE SHIELD

## 2020-01-17 ENCOUNTER — Other Ambulatory Visit: Payer: Self-pay

## 2020-01-17 ENCOUNTER — Emergency Department (HOSPITAL_COMMUNITY)
Admission: EM | Admit: 2020-01-17 | Discharge: 2020-01-17 | Disposition: A | Discharge status: Home / Self Care | Payer: BLUE CROSS/BLUE SHIELD | Attending: Emergency Medicine | Admitting: Emergency Medicine

## 2020-01-17 ENCOUNTER — Encounter (HOSPITAL_COMMUNITY): Payer: Self-pay | Admitting: *Deleted

## 2020-01-17 DIAGNOSIS — E119 Type 2 diabetes mellitus without complications: Secondary | ICD-10-CM | POA: Insufficient documentation

## 2020-01-17 DIAGNOSIS — M545 Low back pain: Secondary | ICD-10-CM | POA: Diagnosis not present

## 2020-01-17 DIAGNOSIS — M542 Cervicalgia: Secondary | ICD-10-CM | POA: Insufficient documentation

## 2020-01-17 DIAGNOSIS — Z7984 Long term (current) use of oral hypoglycemic drugs: Secondary | ICD-10-CM | POA: Insufficient documentation

## 2020-01-17 DIAGNOSIS — I10 Essential (primary) hypertension: Secondary | ICD-10-CM | POA: Diagnosis not present

## 2020-01-17 HISTORY — DX: Type 2 diabetes mellitus without complications: E11.9

## 2020-01-17 HISTORY — DX: Essential (primary) hypertension: I10

## 2020-01-17 MED ORDER — METHOCARBAMOL 500 MG PO TABS
500.0000 mg | ORAL_TABLET | Freq: Two times a day (BID) | ORAL | 0 refills | Status: AC
Start: 2020-01-17 — End: ?
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
## 2020-01-17 NOTE — ED Notes (Signed)
Patient verbalizes understanding of discharge instructions. Opportunity for questioning and answers were provided. Armband removed by staff, pt discharged from ED. Pt. ambulatory and discharged home.  

## 2020-01-17 NOTE — Discharge Instructions (Signed)
Please make sure to go to the ER if you have worsening weakness, numbness, tingling in your arms or legs, increasing neck pain, etc.  You may take over-the-counter Tylenol/ibuprofen for pain.  I prescribed a muscle relaxer, please take this medication at night and do not drink or drive on this medication as it can make you sleepy.  Please follow-up with your primary care doctor within the week.  You may use heat/ice to your neck for aches and pains.

## 2020-12-20 IMAGING — CR DG CERVICAL SPINE COMPLETE 4+V
6 series · 6 of 6 positions shown · non-contrast
Comparison: None.

CLINICAL DATA: Pain after trauma

EXAM:
CERVICAL SPINE - COMPLETE 4+ VIEW

[c-spine lat (1 of 2)]
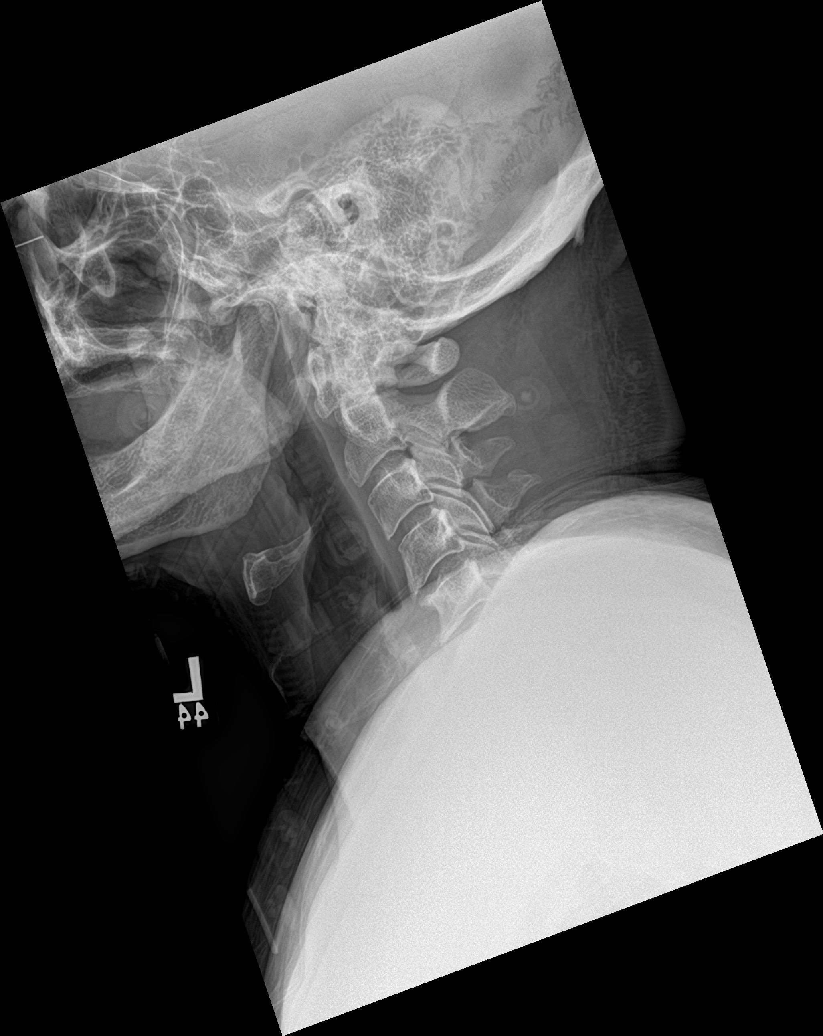

[c-spine obl (1 of 2)]
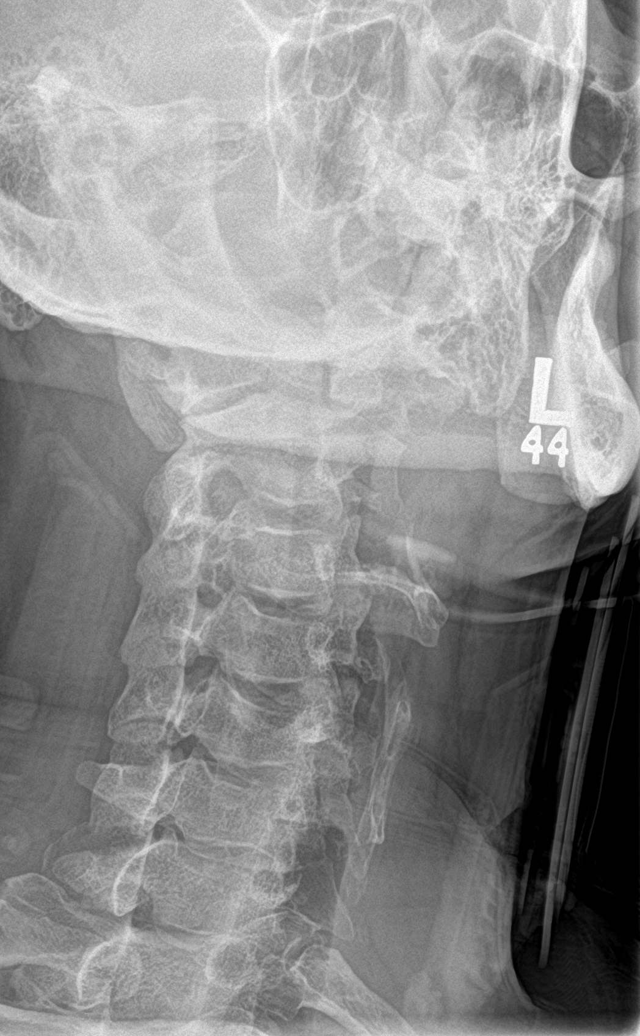

[c-spine obl (2 of 2)]
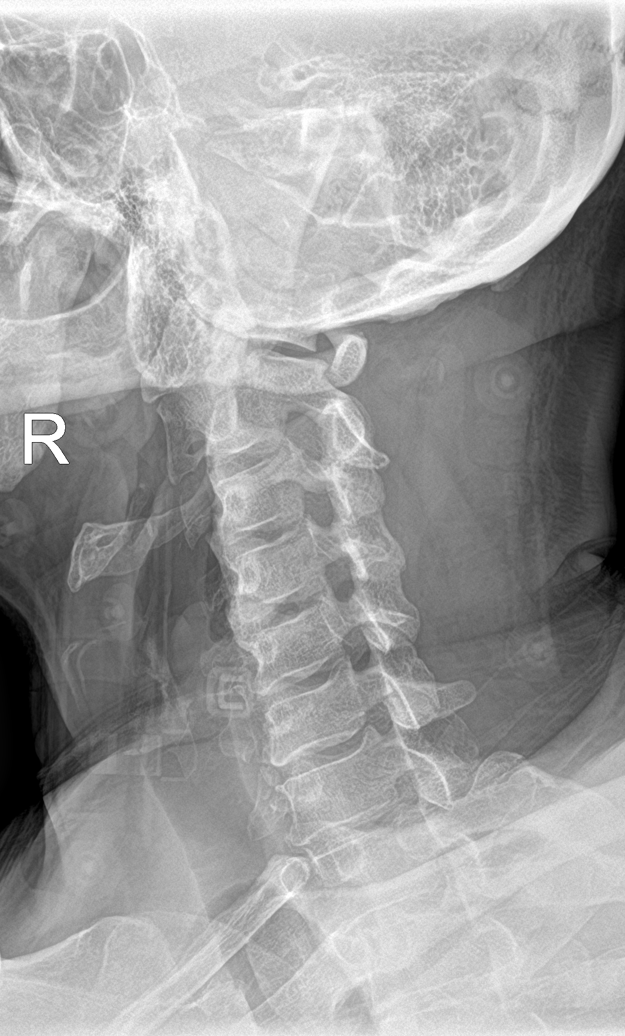

[c-spine ap]
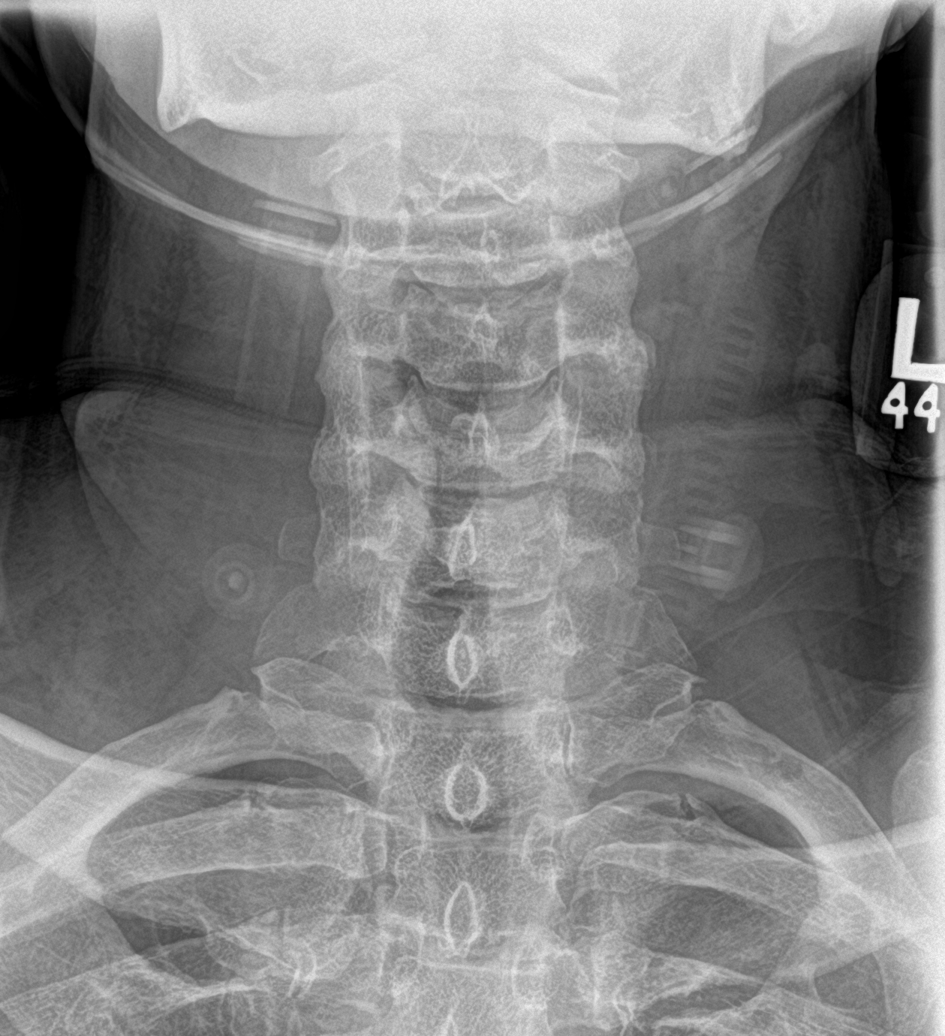

[c-spine open mouth]
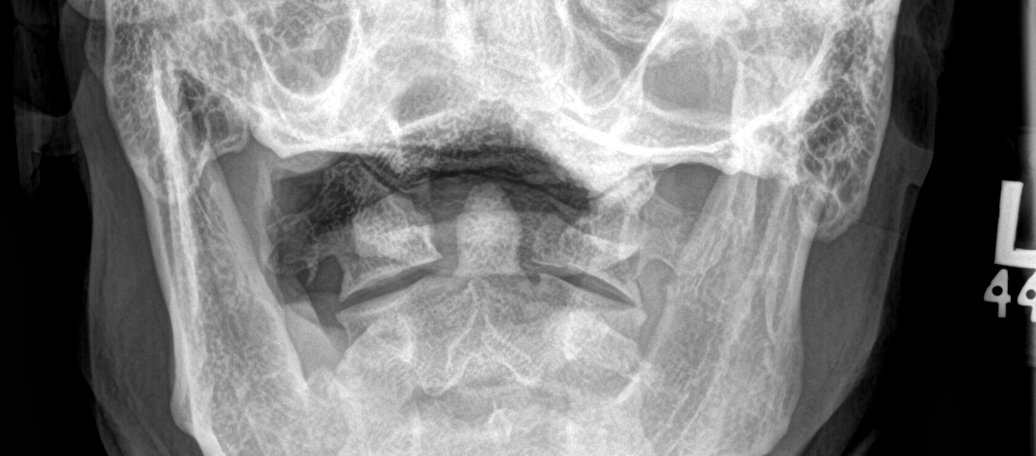

[c-spine lat (2 of 2)]
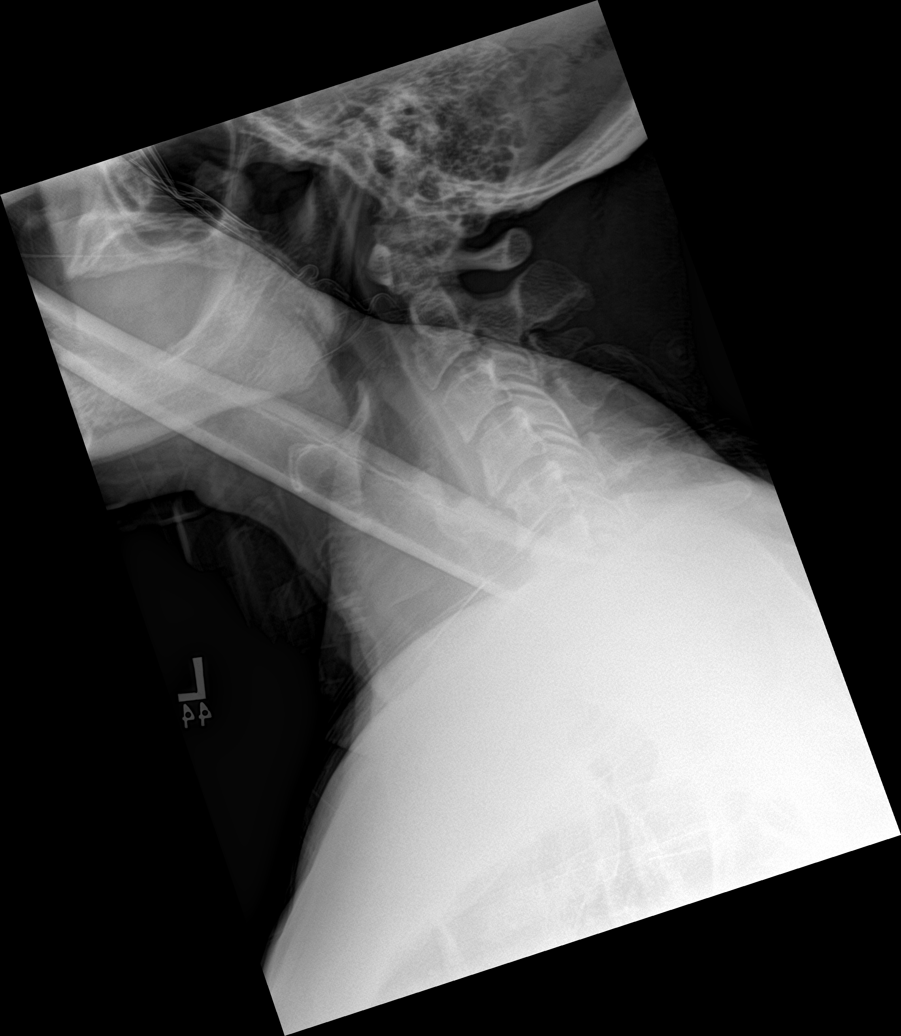

[6 of 6 positions shown; findings below may reference images not displayed]

FINDINGS: The cervical spine is well seen through the C5 level. On the
swimmer's view, the majority of C6 is identified as well although
there is obscuration due to overlapping bones and soft tissues. C7
and T1 are not visualized. Within visualize limits no malalignment
is identified. No fractures are identified. No prevertebral soft
tissue swelling is noted. The pre odontoid space is normal. The
neural foramina are patent. The lateral masses of C1 align with C2.
The odontoid process is normal. The lung apices are normal.
IMPRESSION: 1. Limited visualization of the cervical spine below C5 with
obscured visualization of C6 and no visualization of C7 or T1.
Within these limitations, no fracture or traumatic malalignment is
identified. If concern persists, recommend CT imaging.
